# Patient Record
Sex: Female | Born: 1990 | Race: White | Hispanic: No | Marital: Single | Smoking: Never smoker
Health system: Southern US, Community
[De-identification: ages and names within clinical notes are randomized; demographics above are authoritative.]

## PROBLEM LIST (undated history)

## (undated) ENCOUNTER — Inpatient Hospital Stay (HOSPITAL_COMMUNITY): Payer: Self-pay

## (undated) DIAGNOSIS — K289 Gastrojejunal ulcer, unspecified as acute or chronic, without hemorrhage or perforation: Secondary | ICD-10-CM

---
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
## 2012-01-12 IMAGING — CR DG ABDOMEN 2V
2 series · 2 of 2 positions shown · non-contrast
Comparison: None

CLINICAL DATA: Abdominal pain.

ABDOMEN - 2 VIEW

[w abdomen upright]
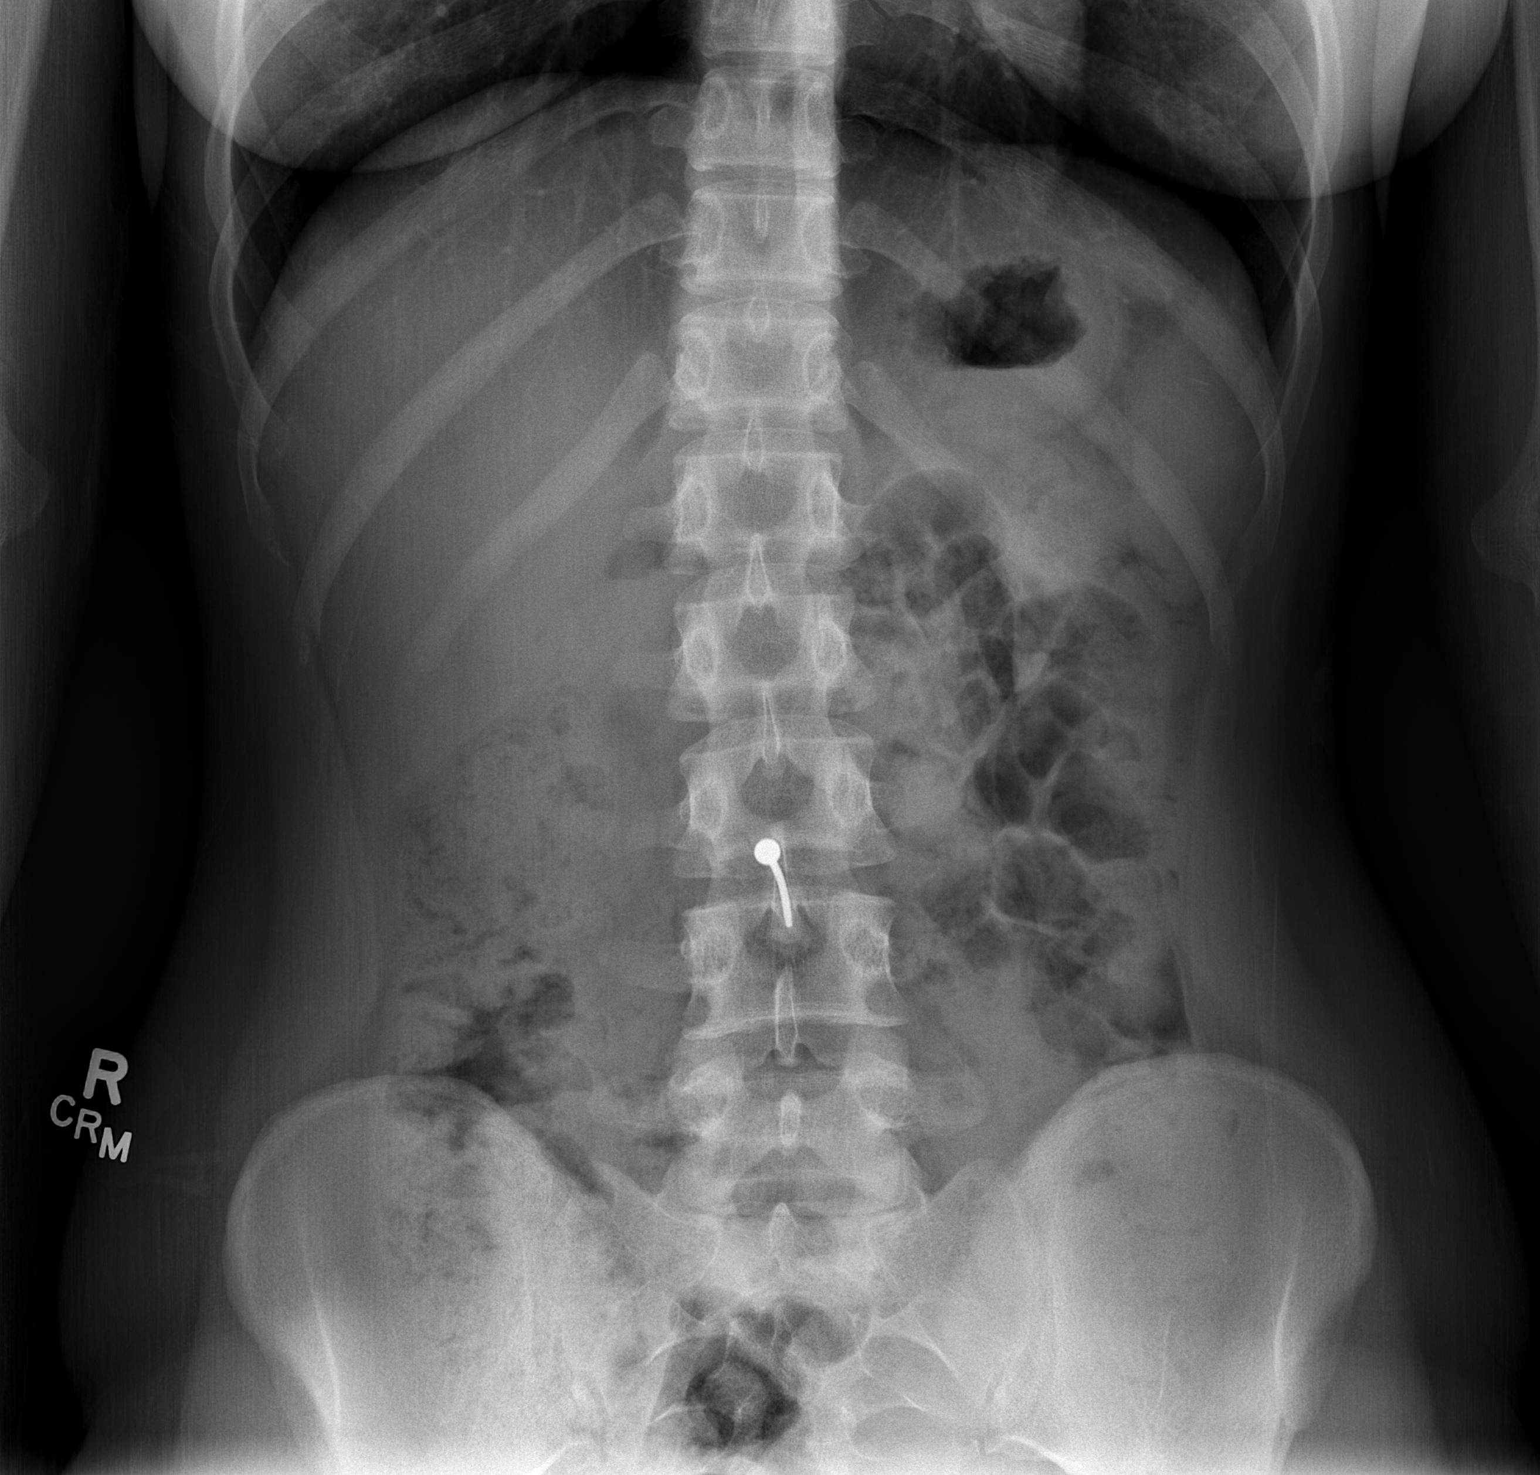

[t abdomen supine]
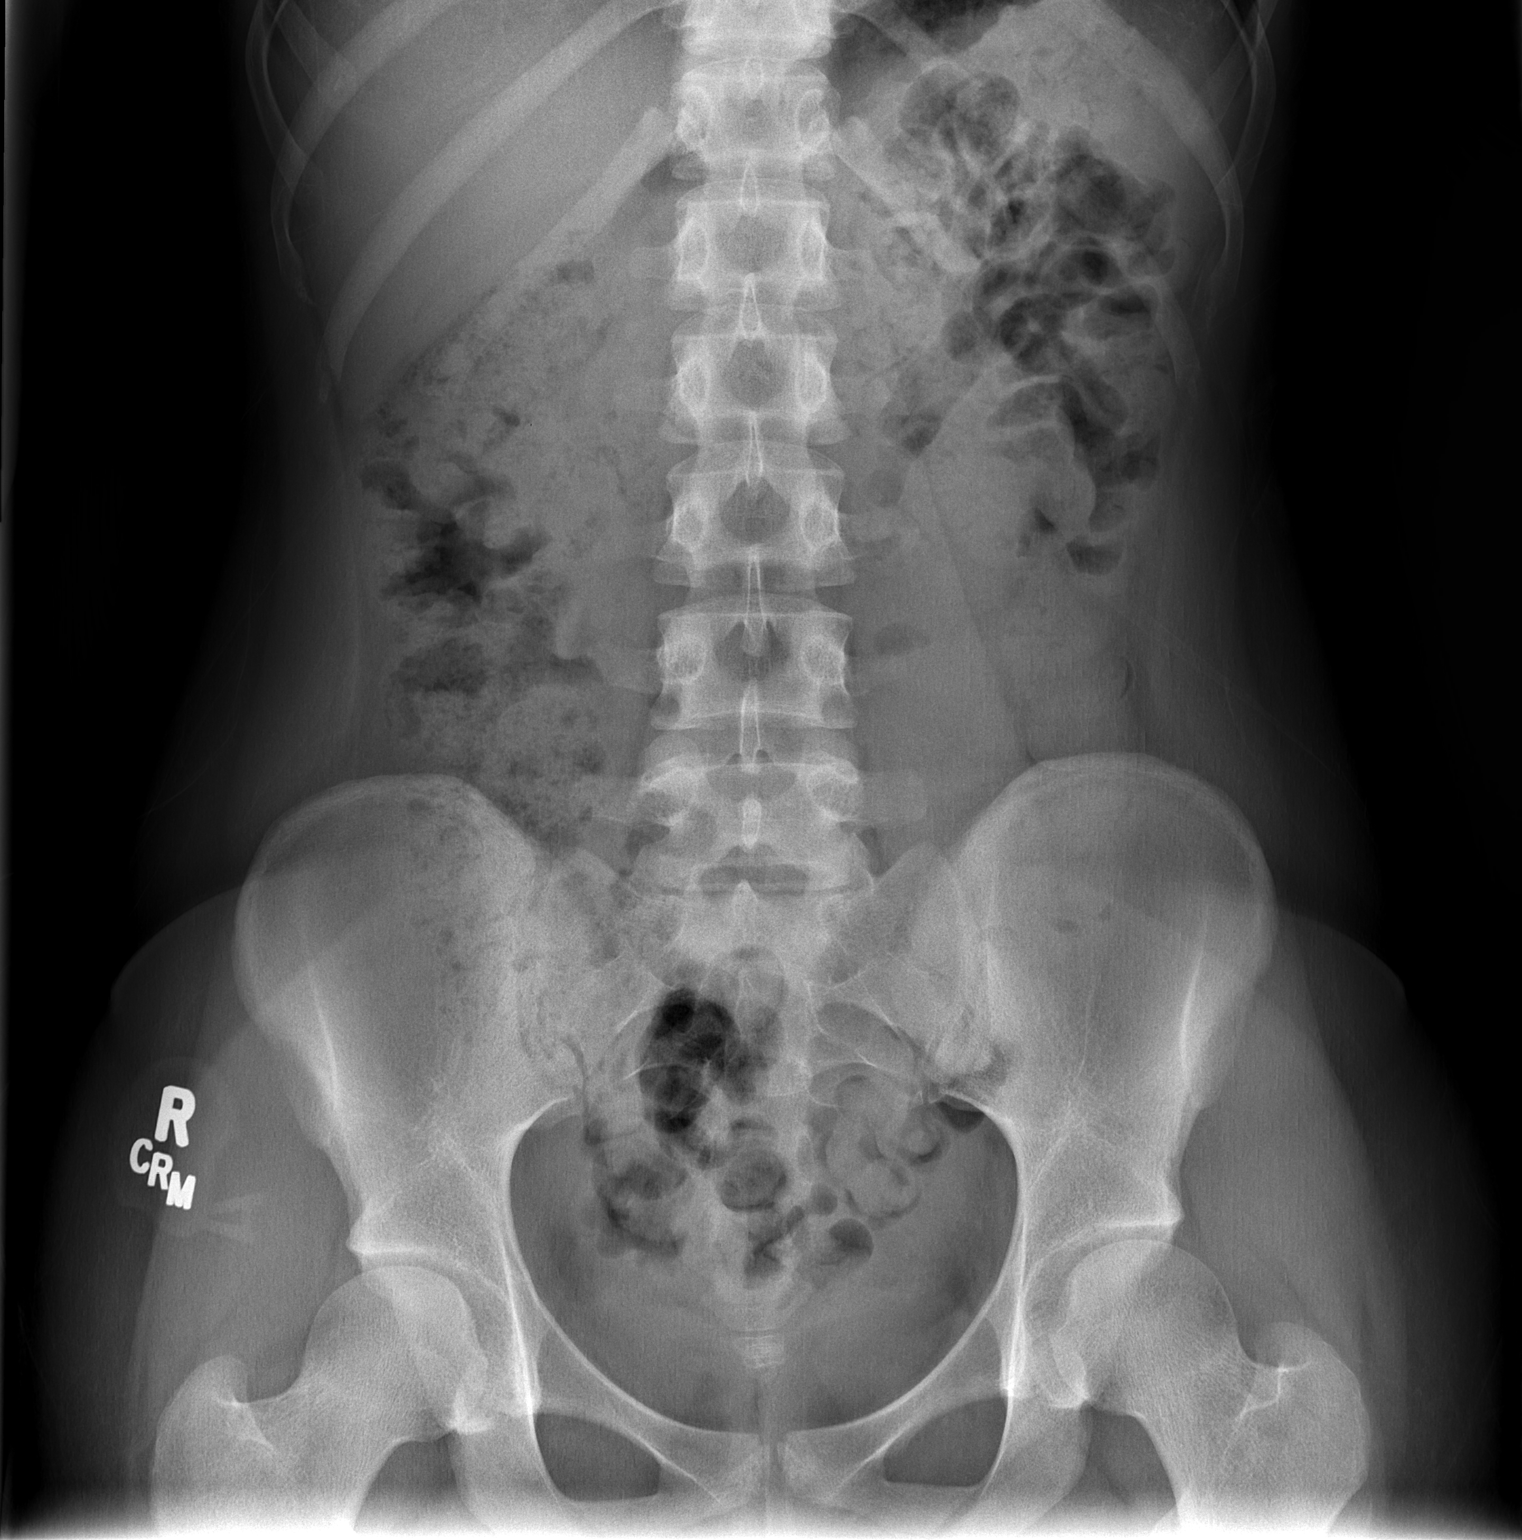

[2 of 2 positions shown; findings below may reference images not displayed]

FINDINGS: Large stool burden throughout the colon. There is normal
bowel gas pattern.  No free air.  No organomegaly or suspicious
calcification.  No acute bony abnormality.
IMPRESSION: Large stool burden.  No acute findings.

## 2012-03-31 ENCOUNTER — Ambulatory Visit: Payer: BC Managed Care – PPO

## 2012-03-31 ENCOUNTER — Ambulatory Visit (INDEPENDENT_AMBULATORY_CARE_PROVIDER_SITE_OTHER): Payer: BC Managed Care – PPO | Admitting: Family Medicine

## 2012-03-31 VITALS — BP 102/64 | HR 87 | Temp 98.2°F | Resp 16 | Ht 64.0 in | Wt 119.0 lb

## 2012-03-31 DIAGNOSIS — M25473 Effusion, unspecified ankle: Secondary | ICD-10-CM

## 2012-03-31 DIAGNOSIS — M79673 Pain in unspecified foot: Secondary | ICD-10-CM

## 2012-03-31 DIAGNOSIS — T148XXA Other injury of unspecified body region, initial encounter: Secondary | ICD-10-CM

## 2012-03-31 DIAGNOSIS — M79609 Pain in unspecified limb: Secondary | ICD-10-CM

## 2012-03-31 MED ORDER — TRAMADOL HCL 50 MG PO TABS
50.0000 mg | ORAL_TABLET | Freq: Three times a day (TID) | ORAL | Status: AC | PRN
Start: 2012-03-31 — End: ?

## 2012-03-31 NOTE — Progress Notes (Signed)
[facility]:  Office Visit  Chief Complaint:  Chief Complaint  Patient presents with  . Foot Pain    injured 1 week ago    HPI: Mandy Flores is a 21 y.o. female who complains of heel pain and ankle swelling,  fell off skateboard and hit heel 1 week ago. Sharp pain. Difficult to bear weight. Took  Ibuprofen with good relief. Using ankle brace. Denies numbness, weakness, tingling  Past Medical History  Diagnosis Date  . Ulcers, marginal    No past surgical history on file. History   Social History  . Marital Status: Single    Spouse Name: N/A    Number of Children: N/A  . Years of Education: N/A   Social History Main Topics  . Smoking status: Never Smoker   . Smokeless tobacco: None  . Alcohol Use: No  . Drug Use: No  . Sexually Active:    Other Topics Concern  . None   Social History Narrative  . None   No family history on file. Allergies  Allergen Reactions  . Shellfish Allergy Swelling and Rash   Prior to Admission medications   Not on File     ROS: The patient denies fevers, chills, night sweats, unintentional weight loss, chest pain, palpitations, wheezing, dyspnea on exertion, nausea, vomiting, abdominal pain, dysuria, hematuria, melena, numbness, weakness, or tingling.   All other systems have been reviewed and were otherwise negative with the exception of those mentioned in the HPI and as above.    PHYSICAL EXAM: Filed Vitals:   03/31/12 1908  BP: 102/64  Pulse: 87  Temp: 98.2 F (36.8 C)  Resp: 16   Filed Vitals:   03/31/12 1908  Height: 5\' 4"  (1.626 m)  Weight: 119 lb (53.978 kg)   Body mass index is 20.43 kg/(m^2).  General: Alert, no acute distress HEENT:  Normocephalic, atraumatic, oropharynx patent.  Cardiovascular:  Regular rate and rhythm, no rubs murmurs or gallops.  No Carotid bruits, radial pulse intact. No pedal edema.  Respiratory: Clear to auscultation bilaterally.  No wheezes, rales, or rhonchi.  No  cyanosis, no use of accessory musculature GI: No organomegaly, abdomen is soft and non-tender, positive bowel sounds.  No masses. Skin: No rashes. Neurologic: Facial musculature symmetric. Psychiatric: Patient is appropriate throughout our interaction. Lymphatic: No cervical lymphadenopathy Musculoskeletal: Gait intact. Left ankle-minimal swelling along medial and lateral malleoli ROM and sensation intact, 5/5, 2/2 DTR, + DP Heel + swelling bilateally Foot -nl   LABS: Results for orders placed during the hospital encounter of 06/05/11  URINALYSIS, ROUTINE W REFLEX MICROSCOPIC      Component Value Range   Color, Urine YELLOW  YELLOW   APPearance CLOUDY (*) CLEAR   Specific Gravity, Urine 1.022  1.005 - 1.030   pH 7.5  5.0 - 8.0   Glucose, UA NEGATIVE  NEGATIVE mg/dL   Hgb urine dipstick MODERATE (*) NEGATIVE   Bilirubin Urine NEGATIVE  NEGATIVE   Ketones, ur NEGATIVE  NEGATIVE mg/dL   Protein, ur NEGATIVE  NEGATIVE mg/dL   Urobilinogen, UA 0.2  0.0 - 1.0 mg/dL   Nitrite NEGATIVE  NEGATIVE   Leukocytes, UA MODERATE (*) NEGATIVE  URINE CULTURE      Component Value Range   Specimen Description URINE, CLEAN CATCH     Special Requests NONE     Culture  Setup Time [id number]     Colony Count >=100,000 COLONIES/ML     Culture ESCHERICHIA COLI  Report Status 06/09/2011 FINAL     Organism ID, Bacteria ESCHERICHIA COLI    POCT PREGNANCY, URINE      Component Value Range   Preg Test, Ur NEGATIVE    URINE MICROSCOPIC-ADD ON      Component Value Range   Squamous Epithelial / LPF RARE  RARE   WBC, UA TOO NUMEROUS TO COUNT  <3 WBC/hpf   RBC / HPF 21-50  <3 RBC/hpf   Bacteria, UA MANY (*) RARE   Urine-Other FIELD OBSCURED BY WBC'S       EKG/XRAY:   Primary read interpreted by Dr. Conley Rolls at [facility]. No fractures or dislocation Minimal swelling calcaneus   ASSESSMENT/PLAN: Encounter Diagnoses  Name Primary?  . Heel pain Yes  . Ankle swelling   . Sprain and strain   .  Contusion    Continue with Ibuprofen, use Tramadol prn Rx [facility] F/u prn     Durrell Barajas PHUONG, DO 03/31/2012 8:26 PM

## 2013-09-13 IMAGING — CR DG ANKLE COMPLETE 3+V*L*
2 series · 2 of 2 positions shown · non-contrast
Comparison: None.

CLINICAL DATA: Left ankle and heel pain

LEFT ANKLE COMPLETE - 3+ VIEW

[AP]
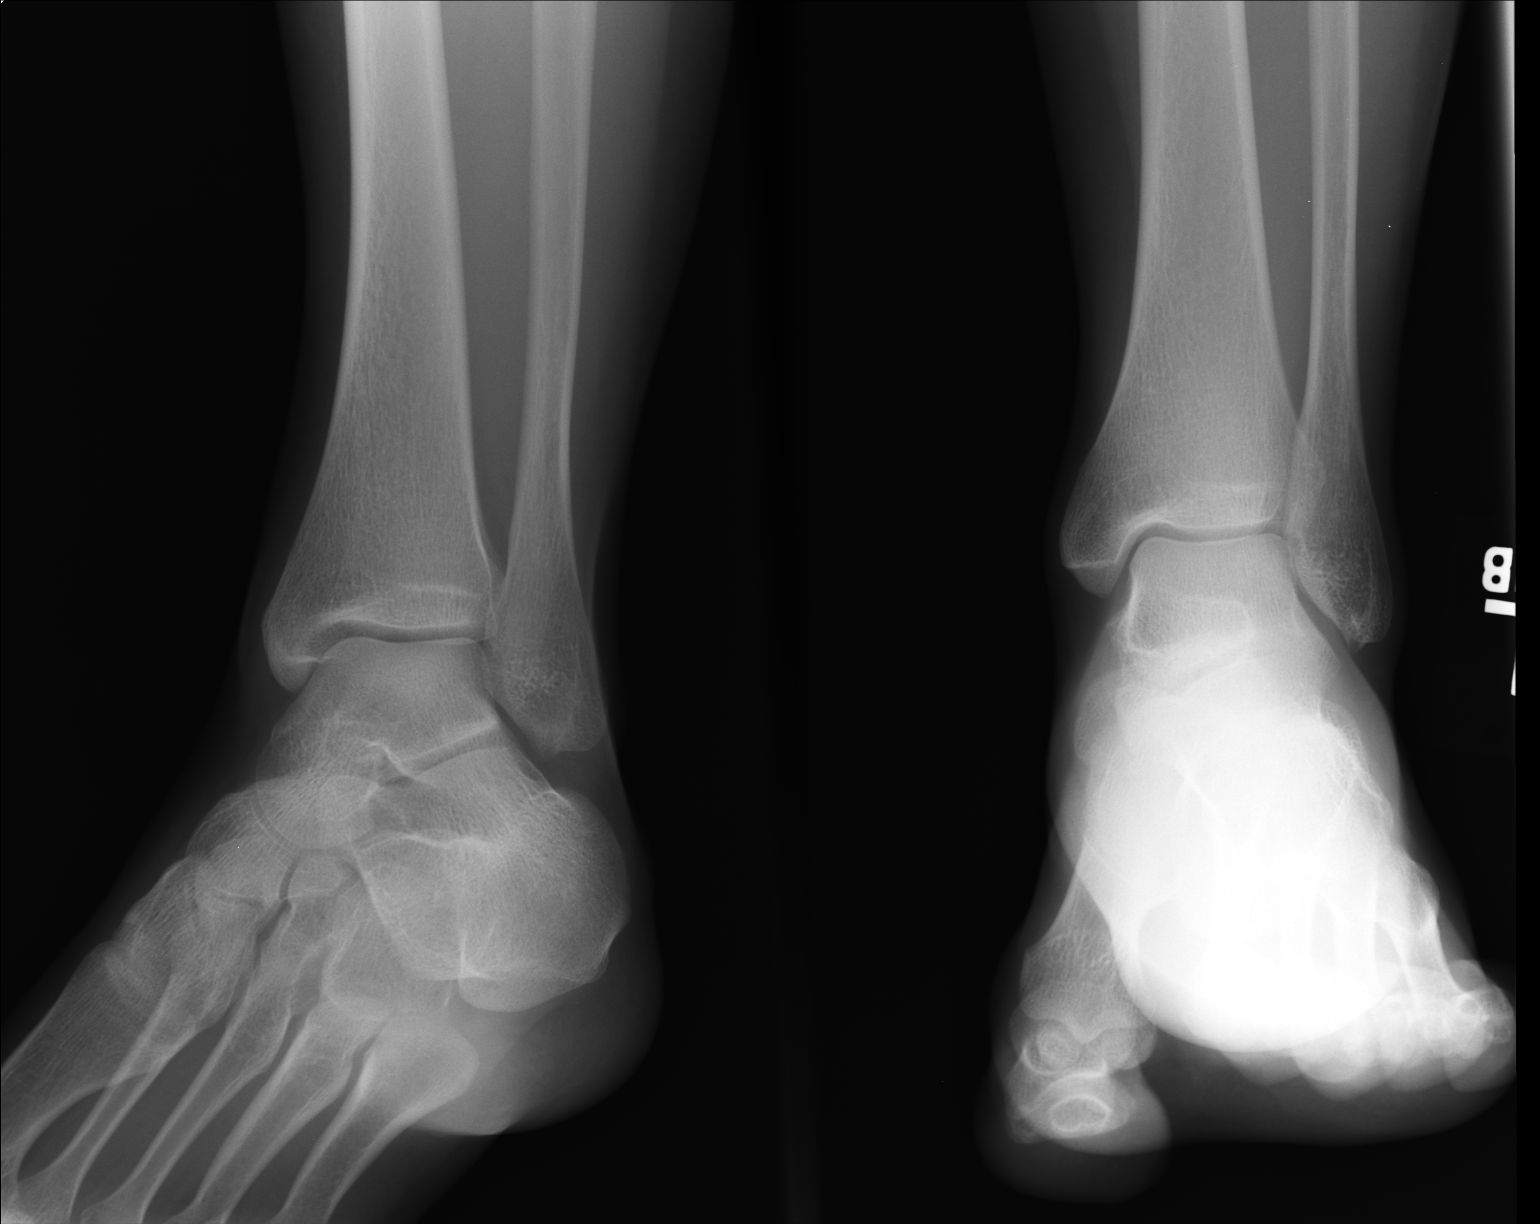

[ap obl int rot]
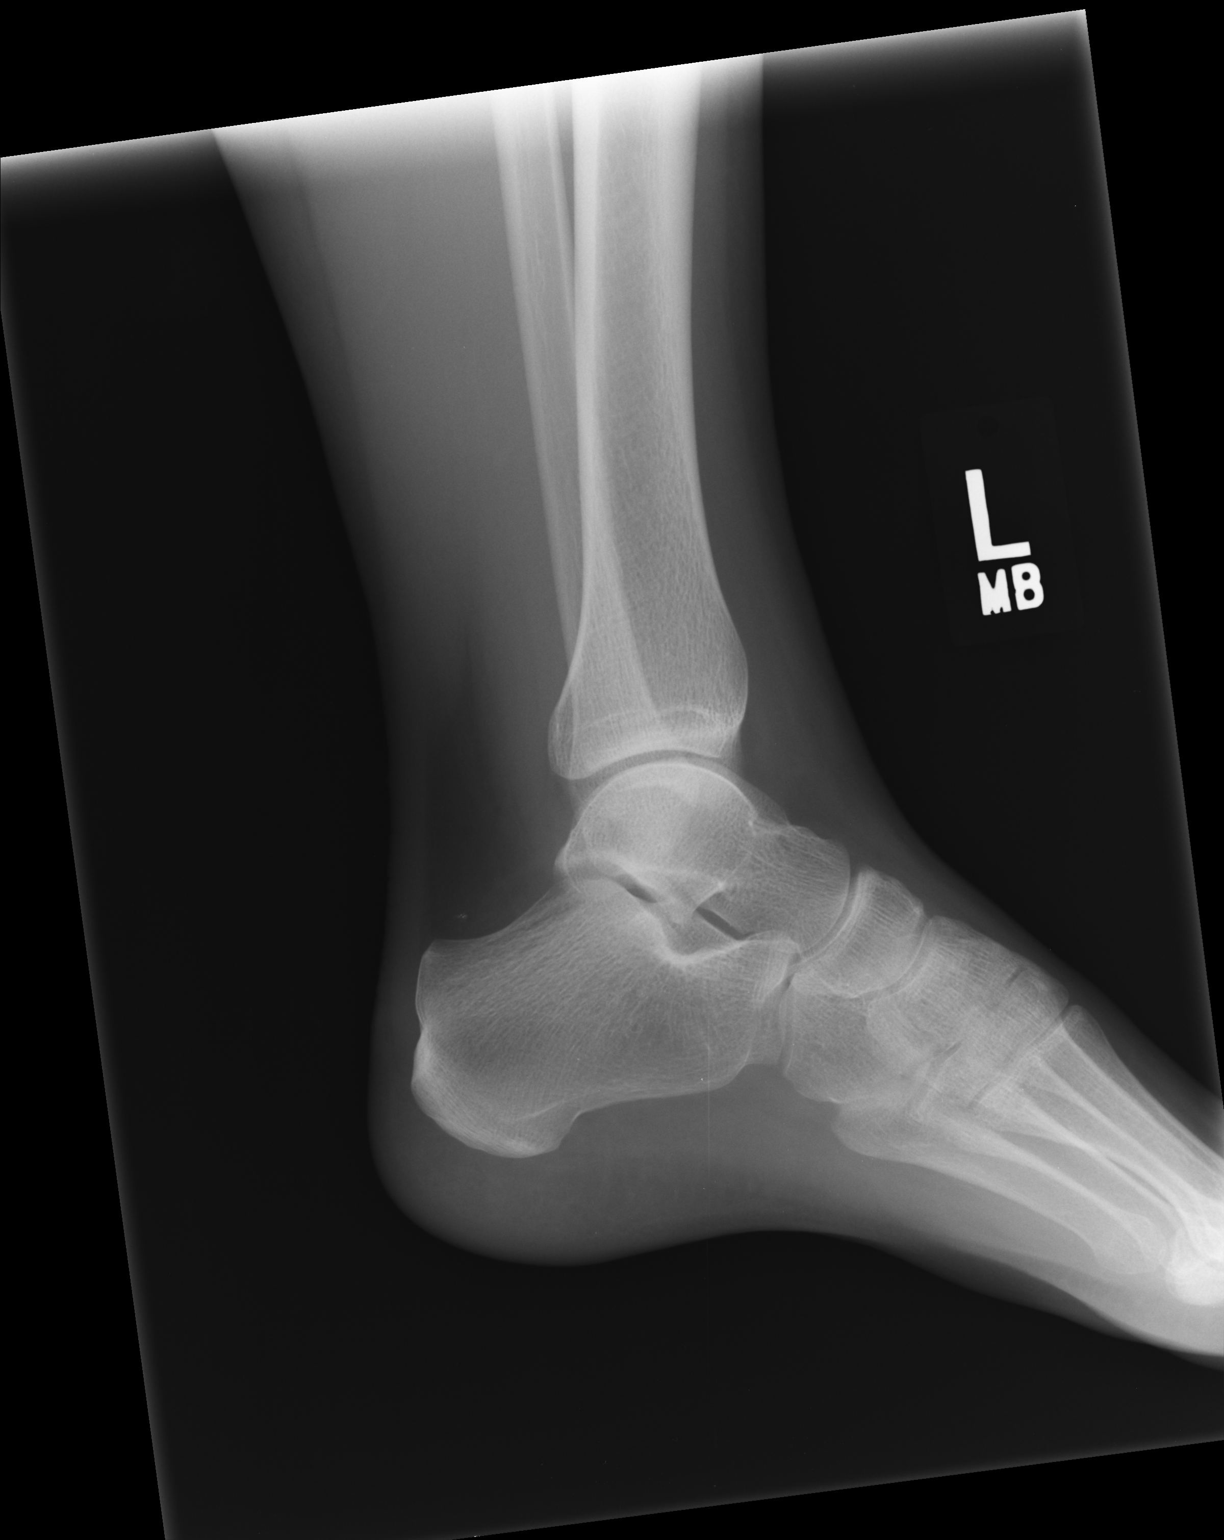

[2 of 2 positions shown; findings below may reference images not displayed]

FINDINGS: No fracture.  The ankle mortise is normally spaced and
aligned.  The soft tissues are unremarkable.
IMPRESSION: Normal left ankle radiographs

## 2017-02-27 ENCOUNTER — Encounter: Payer: Self-pay | Admitting: Obstetrics and Gynecology

## 2017-07-19 ENCOUNTER — Inpatient Hospital Stay (HOSPITAL_COMMUNITY)
Admission: AD | Admit: 2017-07-19 | Discharge: 2017-07-19 | Disposition: A | Discharge status: Home / Self Care | Payer: Medicaid Other | Source: Ambulatory Visit | Attending: Obstetrics and Gynecology | Admitting: Obstetrics and Gynecology

## 2017-07-19 ENCOUNTER — Encounter (HOSPITAL_COMMUNITY): Payer: Self-pay

## 2017-07-19 DIAGNOSIS — J029 Acute pharyngitis, unspecified: Secondary | ICD-10-CM | POA: Diagnosis present

## 2017-07-19 DIAGNOSIS — O99512 Diseases of the respiratory system complicating pregnancy, second trimester: Secondary | ICD-10-CM | POA: Insufficient documentation

## 2017-07-19 DIAGNOSIS — Z3A23 23 weeks gestation of pregnancy: Secondary | ICD-10-CM | POA: Diagnosis not present

## 2017-07-19 DIAGNOSIS — K529 Noninfective gastroenteritis and colitis, unspecified: Secondary | ICD-10-CM | POA: Insufficient documentation

## 2017-07-19 DIAGNOSIS — O99612 Diseases of the digestive system complicating pregnancy, second trimester: Secondary | ICD-10-CM | POA: Insufficient documentation

## 2017-07-19 DIAGNOSIS — O9989 Other specified diseases and conditions complicating pregnancy, childbirth and the puerperium: Secondary | ICD-10-CM | POA: Diagnosis not present

## 2017-07-19 DIAGNOSIS — J069 Acute upper respiratory infection, unspecified: Secondary | ICD-10-CM | POA: Insufficient documentation

## 2017-07-19 LAB — URINALYSIS, ROUTINE W REFLEX MICROSCOPIC
Bilirubin Urine: NEGATIVE
Glucose, UA: NEGATIVE mg/dL
Hgb urine dipstick: NEGATIVE
KETONES UR: NEGATIVE mg/dL
Nitrite: NEGATIVE
PH: 7 (ref 5.0–8.0)
Protein, ur: NEGATIVE mg/dL
SPECIFIC GRAVITY, URINE: 1.013 (ref 1.005–1.030)

## 2017-07-19 LAB — RAPID STREP SCREEN (MED CTR MEBANE ONLY): Streptococcus, Group A Screen (Direct): NEGATIVE

## 2017-07-19 MED ORDER — ACETAMINOPHEN 325 MG PO TABS
650.0000 mg | ORAL_TABLET | Freq: Once | ORAL | Status: AC
  Administered 2017-07-19: 650 mg via ORAL
  Filled 2017-07-19: qty 2

## 2017-07-19 MED ORDER — PROMETHAZINE HCL 25 MG PO TABS: 25.0000 mg | ORAL_TABLET | Freq: Four times a day (QID) | ORAL | 0 refills | Status: AC | PRN

## 2017-07-19 NOTE — Discharge Instructions (Signed)
Upper Respiratory Infection, Adult Most upper respiratory infections (URIs) are caused by a virus. A URI affects the nose, throat, and upper air passages. The most common type of URI is often called "the common cold." Follow these instructions at home:  Take medicines only as told by your doctor.  Gargle warm saltwater or take cough drops to comfort your throat as told by your doctor.  Use a warm mist humidifier or inhale steam from a shower to increase air moisture. This may make it easier to breathe.  Drink enough fluid to keep your pee (urine) clear or pale yellow.  Eat soups and other clear broths.  Have a healthy diet.  Rest as needed.  Go back to work when your fever is gone or your doctor says it is okay. ? You may need to stay home longer to avoid giving your URI to others. ? You can also wear a face mask and wash your hands often to prevent spread of the virus.  Use your inhaler more if you have asthma.  Do not use any tobacco products, including cigarettes, chewing tobacco, or electronic cigarettes. If you need help quitting, ask your doctor. Contact a doctor if:  You are getting worse, not better.  Your symptoms are not helped by medicine.  You have chills.  You are getting more short of breath.  You have brown or red mucus.  You have yellow or brown discharge from your nose.  You have pain in your face, especially when you bend forward.  You have a fever.  You have puffy (swollen) neck glands.  You have pain while swallowing.  You have white areas in the back of your throat. Get help right away if:  You have very bad or constant: ? Headache. ? Ear pain. ? Pain in your forehead, behind your eyes, and over your cheekbones (sinus pain). ? Chest pain.  You have long-lasting (chronic) lung disease and any of the following: ? Wheezing. ? Long-lasting cough. ? Coughing up blood. ? A change in your usual mucus.  You have a stiff neck.  You have  changes in your: ? Vision. ? Hearing. ? Thinking. ? Mood. This information is not intended to replace advice given to you by your health care provider. Make sure you discuss any questions you have with your health care provider. Document Released: 12/10/2007 Document Revised: 02/24/2016 Document Reviewed: 09/28/2013 Elsevier Interactive Patient Education  2018 [employer]  

## 2017-07-19 NOTE — MAU Note (Signed)
Patient onset of sore throat and congestion since yesterday, has a lot of congestion in her throat, has been making her throw up.

## 2017-07-19 NOTE — MAU Provider Note (Signed)
Patient Mandy Flores is a 27 y.o.  G1P0 At 74w1dhere with complaints of "throat feeling weird"; throwing up and congestion.   Patient denies bleeding, abdominal pain, abnormal discharge, dysuria.   She started throwing up at 1 am. She ate PF changs at 5 pm; earlier in the day she snacked on salad, pop tarts, orange, mac and cheese. She felt congested during the day with a sore throat as well but it got worse at night.  History     CSN: [id number] Arrival date and time: 07/19/17 0850   None     Chief Complaint  Patient presents with  . Sore Throat  . Nasal Congestion  . Emesis   Sore Throat   This is a new problem. The current episode started yesterday. The problem has been resolved. Maximum temperature: patient didn't take her temp at home; she can't tell if she has a fever or not.  Associated symptoms include congestion, coughing and vomiting. Pertinent negatives include no ear pain or shortness of breath. Treatments tried: benedryl.  Emesis   This is a new problem. The current episode started yesterday. The problem occurs 2 to 4 times per day. Progression since onset: last emesis was at 6 am. The emesis has an appearance of bile. Associated symptoms include coughing. Pertinent negatives include no fever.    OB History    Gravida Para Term Preterm AB Living   1             SAB TAB Ectopic Multiple Live Births                  Past Medical History:  Diagnosis Date  . Ulcers, marginal     History reviewed. No pertinent surgical history.  History reviewed. No pertinent family history.  Social History   Tobacco Use  . Smoking status: Never Smoker  . Smokeless tobacco: Never Used  Substance Use Topics  . Alcohol use: No  . Drug use: No    Allergies:  Allergies  Allergen Reactions  . Shellfish Allergy Swelling and Rash    Medications Prior to Admission  Medication Sig Dispense Refill Last Dose  . traMADol (ULTRAM) 50 MG tablet Take 1 tablet (50 mg total) by  mouth every 8 (eight) hours as needed for pain. 30 tablet 0     Review of Systems  Constitutional: Positive for fatigue. Negative for fever.  HENT: Positive for congestion, sneezing and sore throat. Negative for ear pain.   Eyes: Negative for discharge.  Respiratory: Positive for cough. Negative for shortness of breath.   Gastrointestinal: Positive for vomiting.  Genitourinary: Negative.   Musculoskeletal: Negative.   Neurological: Negative.   Psychiatric/Behavioral: Negative.    Physical Exam   Blood pressure 122/66, pulse (!) 128, temperature 98.3 F (36.8 C), temperature source Oral, resp. rate 16, height 5' 3"  (1.6 m), weight 149 lb 1.9 oz (67.6 kg).  Physical Exam  Constitutional: She is oriented to person, place, and time. She appears well-developed.  HENT:  Head: Normocephalic.  Neck: Normal range of motion.  Respiratory: Effort normal.  GI: Soft. Bowel sounds are normal.  Musculoskeletal: Normal range of motion.  Neurological: She is alert and oriented to person, place, and time.  Skin: Skin is warm and dry.  Psychiatric: She has a normal mood and affect.    MAU Course  Procedures  MDM -Strep A test pending -UA negative for dehydration -650 mg of tylenol -NST: 150 bpm, mod variabiility, 10X 10 acel, no  decels, no contractions.   Discussed plan of care with Eddie Dibbles, CNM, who recommends discharge with RX for phenergan.  Assessment and Plan   1. Viral upper respiratory tract infection   2. Gastroenteritis    3. Patient stable for discharge with RX for phenergan and comfort measures. List of safe medications in pregnancy given.     Mervyn Skeeters [place] 07/19/2017, 10:06 AM

## 2017-07-21 LAB — CULTURE, GROUP A STREP (THRC)

## 2018-02-06 ENCOUNTER — Encounter (HOSPITAL_COMMUNITY): Payer: Self-pay
